# Patient Record
Sex: Female | Born: 1965 | Smoking: Current every day smoker
Health system: Southern US, Community
[De-identification: ages and names within clinical notes are randomized; demographics above are authoritative.]

## PROBLEM LIST (undated history)

## (undated) DIAGNOSIS — F419 Anxiety disorder, unspecified: Secondary | ICD-10-CM

## (undated) HISTORY — DX: Anxiety disorder, unspecified: F41.9

## (undated) HISTORY — PX: OTHER SURGICAL HISTORY: SHX169

## (undated) HISTORY — PX: COLONOSCOPY: SHX174

---

## 1998-12-28 HISTORY — PX: PLACEMENT OF BREAST IMPLANTS: SHX6334

## 2011-09-17 ENCOUNTER — Other Ambulatory Visit (HOSPITAL_COMMUNITY)
Admission: RE | Admit: 2011-09-17 | Discharge: 2011-09-17 | Disposition: A | Discharge status: Home / Self Care | Payer: 59 | Source: Ambulatory Visit | Attending: Family Medicine | Admitting: Family Medicine

## 2011-09-17 DIAGNOSIS — Z1159 Encounter for screening for other viral diseases: Secondary | ICD-10-CM | POA: Insufficient documentation

## 2011-09-17 DIAGNOSIS — Z124 Encounter for screening for malignant neoplasm of cervix: Secondary | ICD-10-CM | POA: Insufficient documentation

## 2014-01-05 ENCOUNTER — Other Ambulatory Visit: Payer: Self-pay | Admitting: Endocrinology

## 2014-01-05 DIAGNOSIS — R1011 Right upper quadrant pain: Secondary | ICD-10-CM

## 2014-01-10 ENCOUNTER — Other Ambulatory Visit: Payer: 59

## 2015-08-20 ENCOUNTER — Encounter (HOSPITAL_COMMUNITY): Payer: Self-pay | Admitting: Emergency Medicine

## 2015-08-20 DIAGNOSIS — T819XXA Unspecified complication of procedure, initial encounter: Secondary | ICD-10-CM | POA: Diagnosis present

## 2015-08-20 DIAGNOSIS — T8189XA Other complications of procedures, not elsewhere classified, initial encounter: Secondary | ICD-10-CM | POA: Diagnosis not present

## 2015-08-20 DIAGNOSIS — Z720 Tobacco use: Secondary | ICD-10-CM | POA: Diagnosis not present

## 2015-08-20 DIAGNOSIS — Y848 Other medical procedures as the cause of abnormal reaction of the patient, or of later complication, without mention of misadventure at the time of the procedure: Secondary | ICD-10-CM | POA: Insufficient documentation

## 2015-08-20 LAB — BASIC METABOLIC PANEL
Anion gap: 10 (ref 5–15)
BUN: 9 mg/dL (ref 6–20)
CO2: 26 mmol/L (ref 22–32)
CREATININE: 0.59 mg/dL (ref 0.44–1.00)
Calcium: 9 mg/dL (ref 8.9–10.3)
Chloride: 99 mmol/L — ABNORMAL LOW (ref 101–111)
GFR calc Af Amer: >60 mL/min (ref >60)
GFR calc non Af Amer: >60 mL/min (ref >60)
GLUCOSE: 126 mg/dL — AB (ref 65–99)
Potassium: 3.7 mmol/L (ref 3.5–5.1)
SODIUM: 135 mmol/L (ref 135–145)

## 2015-08-20 LAB — CBC WITH DIFFERENTIAL/PLATELET
Basophils Absolute: 0 10*3/uL (ref 0.0–0.1)
Basophils Relative: 0 % (ref 0–1)
EOS ABS: 0.1 10*3/uL (ref 0.0–0.7)
Eosinophils Relative: 1 % (ref 0–5)
HCT: 34.1 % — ABNORMAL LOW (ref 36.0–46.0)
HEMOGLOBIN: 11.6 g/dL — AB (ref 12.0–15.0)
LYMPHS ABS: 1.7 10*3/uL (ref 0.7–4.0)
Lymphocytes Relative: 15 % (ref 12–46)
MCH: 30.5 pg (ref 26.0–34.0)
MCHC: 34 g/dL (ref 30.0–36.0)
MCV: 89.7 fL (ref 78.0–100.0)
MONOS PCT: 8 % (ref 3–12)
Monocytes Absolute: 0.9 10*3/uL (ref 0.1–1.0)
Neutro Abs: 9 10*3/uL — ABNORMAL HIGH (ref 1.7–7.7)
Neutrophils Relative %: 76 % (ref 43–77)
Platelets: 407 10*3/uL — ABNORMAL HIGH (ref 150–400)
RBC: 3.8 MIL/uL — ABNORMAL LOW (ref 3.87–5.11)
RDW: 13 % (ref 11.5–15.5)
WBC: 11.7 10*3/uL — ABNORMAL HIGH (ref 4.0–10.5)

## 2015-08-20 NOTE — ED Notes (Signed)
Pt reports tummy-tuck on Thursday which she had to have operated on a second time- sts incision went down to muscle. Today when she takes her compression dressing off copious amounts of clear/tan fluid comes out. Pt using many dressings.

## 2015-08-21 ENCOUNTER — Other Ambulatory Visit (HOSPITAL_COMMUNITY): Payer: Self-pay | Admitting: Endocrinology

## 2015-08-21 ENCOUNTER — Emergency Department (HOSPITAL_COMMUNITY)
Admission: EM | Admit: 2015-08-21 | Discharge: 2015-08-21 | Disposition: A | Discharge status: Home / Self Care | Payer: Managed Care, Other (non HMO) | Attending: Emergency Medicine | Admitting: Emergency Medicine

## 2015-08-21 ENCOUNTER — Ambulatory Visit (HOSPITAL_COMMUNITY)
Admission: RE | Admit: 2015-08-21 | Discharge: 2015-08-21 | Disposition: A | Discharge status: Home / Self Care | Payer: Managed Care, Other (non HMO) | Source: Ambulatory Visit | Attending: Endocrinology | Admitting: Endocrinology

## 2015-08-21 ENCOUNTER — Ambulatory Visit (HOSPITAL_COMMUNITY)
Admission: RE | Admit: 2015-08-21 | Discharge: 2015-08-21 | Disposition: A | Discharge status: Home / Self Care | Payer: Managed Care, Other (non HMO) | Source: Ambulatory Visit | Attending: Interventional Radiology | Admitting: Interventional Radiology

## 2015-08-21 DIAGNOSIS — K769 Liver disease, unspecified: Secondary | ICD-10-CM | POA: Diagnosis not present

## 2015-08-21 DIAGNOSIS — T8189XA Other complications of procedures, not elsewhere classified, initial encounter: Secondary | ICD-10-CM

## 2015-08-21 DIAGNOSIS — L7622 Postprocedural hemorrhage and hematoma of skin and subcutaneous tissue following other procedure: Secondary | ICD-10-CM | POA: Insufficient documentation

## 2015-08-21 DIAGNOSIS — IMO0002 Reserved for concepts with insufficient information to code with codable children: Secondary | ICD-10-CM

## 2015-08-21 DIAGNOSIS — S301XXA Contusion of abdominal wall, initial encounter: Secondary | ICD-10-CM

## 2015-08-21 DIAGNOSIS — T8131XA Disruption of external operation (surgical) wound, not elsewhere classified, initial encounter: Secondary | ICD-10-CM | POA: Insufficient documentation

## 2015-08-21 DIAGNOSIS — Y838 Other surgical procedures as the cause of abnormal reaction of the patient, or of later complication, without mention of misadventure at the time of the procedure: Secondary | ICD-10-CM | POA: Diagnosis not present

## 2015-08-21 MED ORDER — LIDOCAINE-EPINEPHRINE 2 %-1:100000 IJ SOLN
INTRAMUSCULAR | Status: AC
  Filled 2015-08-21: qty 1

## 2015-08-21 MED ORDER — IOHEXOL 300 MG/ML  SOLN
100.0000 mL | Freq: Once | INTRAMUSCULAR | Status: AC | PRN
  Administered 2015-08-21: 100 mL via INTRAVENOUS

## 2015-08-21 MED ORDER — IOHEXOL 300 MG/ML  SOLN
50.0000 mL | Freq: Once | INTRAMUSCULAR | Status: AC | PRN
  Administered 2015-08-21: 50 mL via ORAL

## 2015-08-21 NOTE — ED Provider Notes (Signed)
This chart was scribed for [name], DO by [name], ED Scribe. This patient was seen in room B19C/B19C and the patient's care was started 12:50 AM.   TIME SEEN: 12:50 AM   CHIEF COMPLAINT:  Chief Complaint  Patient presents with  . Post-op Problem     HPI:  HPI Comments: Allison Lewis is a 49 y.o. female without any pertinent past medical history who presents to the Emergency Department here for a post-op problem this evening. Pt underwent an abdominoplasty and liposuction performed in June of this year. Wound was recently excised due to dehiscence Thursday 8/18. [name] has noted a large amount of drainage from the healing wound in last 24 hours. Denies any signs of infection such as worsening drainage or warmth. She denies any worsening pain to abdomen. She is taking Percocet daily which is successfully managing her pain.Pt is also on Amoxicillin daily. No fever, chills, nausea, or vomiting. No known allergies to medications.  Surgery performed by: Dr. Nathanial Rancher at [facility]  ROS: See HPI Constitutional: no fever  Eyes: no drainage  ENT: no runny nose   Cardiovascular:  no chest pain  Resp: no SOB  GI: no vomiting. Positive abdominal pain GU: no dysuria Integumentary: no rash  Allergy: no hives  Musculoskeletal: no leg swelling  Neurological: no slurred speech Skin: Positive wound ROS otherwise negative  PAST MEDICAL HISTORY/PAST SURGICAL HISTORY:  History reviewed. No pertinent past medical history.  MEDICATIONS:  Prior to Admission medications   Not on File    ALLERGIES:  No Known Allergies  SOCIAL HISTORY:  Social History  Substance Use Topics  . Smoking status: Current Every Day Smoker -- 0.50 packs/day    Types: Cigarettes  . Smokeless tobacco: Not on file  . Alcohol Use: Yes     Comment: 3 days a week    FAMILY HISTORY: No family history on file.  EXAM: BP 109/81 mmHg  Pulse 95   Temp(Src) 98.6 F (37 C) (Oral)  Resp 16  Ht 5\' 6"  (1.676 m)  Wt 160 lb (72.576 kg)  BMI 25.84 kg/m2  SpO2 95%  LMP 07/24/2015 CONSTITUTIONAL: Alert and oriented and responds appropriately to questions. Well-appearing; well-nourished HEAD: Normocephalic EYES: Conjunctivae clear, PERRL ENT: normal nose; no rhinorrhea; moist mucous membranes; pharynx without lesions noted NECK: Supple, no meningismus, no LAD  CARD: RRR; S1 and S2 appreciated; no murmurs, no clicks, no rubs, no gallops RESP: Normal chest excursion without splinting or tachypnea; breath sounds clear and equal bilaterally; no wheezes, no rhonchi, no rales, no hypoxia or respiratory distress, speaking full sentences ABD/GI: Normal bowel sounds; non-distended; soft, Mild tenderness to palpation throughout abdomen, no rebound, no guarding, no peritoneal signs; 10 cm Lower transverse abdominal surgical scar with 1 cm area of mild dehiscence at the L lateral aspect with small amount of serousanguineous drainage. No purulent drainage. Some erythema and eccymosis around scar without warmth BACK:  The back appears normal and is non-tender to palpation, there is no CVA tenderness EXT: Normal ROM in all joints; non-tender to palpation; no edema; normal capillary refill; no cyanosis, no calf tenderness or swelling    SKIN: Normal color for age and race; warm NEURO: Moves all extremities equally, sensation to light touch intact diffusely, cranial nerves II through XII intact PSYCH: The patient's mood and manner are appropriate. Grooming and personal hygiene are appropriate.  MEDICAL DECISION MAKING: Patient here with drainage from her surgical incision. Reports she  has had a seroma in the past that was aspirated during several visits with her plastic surgeon. No fever, purulent drainage. She has a very small open area to her surgical incision with a small amount of serous and was drainage today. No sign of infection. Abdomen seems appropriately  tender. Hemodynamically stable. Labs unremarkable. At this time I do not feel she needs emergent imaging and have discussed this with patient. Will contact plastic surgery on-call for further recommendations.  ED PROGRESS: 1:40 AM  Attempted to get in contact with patient's plastic surgeon several times without success. She is also left several messages with the answering service. Given drainage has almost completely resolved and patient is hemodynamically stable without signs of infection I feel she is safe to be discharged and follow-up with her plastic surgeon in the morning. Patient is a nurse herself and seems very reliable. Discussed with her return precautions. She is comfortable with this plan.      I personally performed the services described in this documentation, which was scribed in my presence. The recorded information has been reviewed and is accurate.   [place], DO 08/21/15 0140

## 2015-08-21 NOTE — ED Notes (Signed)
Pt reports taking a percocet at 2230 in waiting room, reports pain is reasonably well-managed.

## 2015-08-21 NOTE — Procedures (Signed)
Technically successful US guided placed of a 8 Fr drainage catheter placement into the right mid anterior abdominal wall yielding 60 cc of slightly red tinged serous fluid.   All aspirated samples sent to the laboratory for analysis.   No immediate post procedural complications.   Ronny Bacon, MD Pager #: [phone]

## 2015-08-21 NOTE — Discharge Instructions (Signed)
Please call your plastic surgeon in the morning to schedule a follow-up appointment. If your drainage increases and you cannot get in touch with your doctor, please return to the hospital. If you have increasing pain, swelling of your abdomen, vomiting and cannot stop, fever, please return to the ER.    Incision Care An incision is when a surgeon cuts into your body tissues. After surgery, the incision needs to be cared for properly to prevent infection.  HOME CARE INSTRUCTIONS   Take all medicine as directed by your caregiver. Only take over-the-counter or prescription medicines for pain, discomfort, or fever as directed by your caregiver.  Do not remove your bandage (dressing) or get your incision wet until your surgeon gives you permission. In the event that your dressing becomes wet, dirty, or starts to smell, change the dressing and call your surgeon for instructions as soon as possible.  Take showers. Do not take tub baths, swim, or do anything that may soak the wound until it is healed.  Resume your normal diet and activities as directed or allowed.  Avoid lifting any weight until you are instructed otherwise.  Use anti-itch antihistamine medicine as directed by your caregiver. The wound may itch when it is healing. Do not pick or scratch at the wound.  Follow up with your caregiver for stitch (suture) or staple removal as directed.  Drink enough fluids to keep your urine clear or pale yellow. SEEK MEDICAL CARE IF:   You have redness, swelling, or increasing pain in the wound that is not controlled with medicine.  You have drainage, blood, or pus coming from the wound that lasts longer than 1 day.  You develop muscle aches, chills, or a general ill feeling.  You notice a bad smell coming from the wound or dressing.  Your wound edges separate after the sutures, staples, or skin adhesive strips have been removed.  You develop persistent nausea or vomiting. SEEK IMMEDIATE  MEDICAL CARE IF:   You have a fever.  You develop a rash.  You develop dizzy episodes or faint while standing.  You have difficulty breathing.  You develop any reaction or side effects to medicine given. MAKE SURE YOU:   Understand these instructions.  Will watch your condition.  Will get help right away if you are not doing well or get worse. Document Released: 07/03/2005 Document Revised: 03/07/2012 Document Reviewed: 02/07/2014 [facility] Patient Information 2015 [place], [place]. This information is not intended to replace advice given to you by your health care provider. Make sure you discuss any questions you have with your health care provider.

## 2015-08-24 LAB — CULTURE, ROUTINE-ABSCESS: Special Requests: NORMAL

## 2015-08-26 ENCOUNTER — Other Ambulatory Visit: Payer: Self-pay | Admitting: Interventional Radiology

## 2015-08-26 DIAGNOSIS — T792XXA Traumatic secondary and recurrent hemorrhage and seroma, initial encounter: Principal | ICD-10-CM

## 2015-08-26 DIAGNOSIS — T888XXA Other specified complications of surgical and medical care, not elsewhere classified, initial encounter: Principal | ICD-10-CM

## 2015-08-26 DIAGNOSIS — IMO0001 Reserved for inherently not codable concepts without codable children: Secondary | ICD-10-CM

## 2015-08-28 ENCOUNTER — Other Ambulatory Visit: Payer: Self-pay | Admitting: Physician Assistant

## 2015-08-29 ENCOUNTER — Ambulatory Visit (HOSPITAL_COMMUNITY)
Admission: RE | Admit: 2015-08-29 | Discharge: 2015-08-29 | Disposition: A | Discharge status: Home / Self Care | Payer: Managed Care, Other (non HMO) | Source: Ambulatory Visit | Attending: Interventional Radiology | Admitting: Interventional Radiology

## 2015-08-29 ENCOUNTER — Other Ambulatory Visit: Payer: Self-pay | Admitting: Interventional Radiology

## 2015-08-29 DIAGNOSIS — IMO0001 Reserved for inherently not codable concepts without codable children: Secondary | ICD-10-CM

## 2015-08-29 DIAGNOSIS — L7621 Postprocedural hemorrhage and hematoma of skin and subcutaneous tissue following a dermatologic procedure: Secondary | ICD-10-CM | POA: Diagnosis not present

## 2015-08-29 DIAGNOSIS — T792XXA Traumatic secondary and recurrent hemorrhage and seroma, initial encounter: Secondary | ICD-10-CM

## 2015-08-29 DIAGNOSIS — Z4803 Encounter for change or removal of drains: Secondary | ICD-10-CM | POA: Diagnosis present

## 2015-08-29 DIAGNOSIS — T888XXA Other specified complications of surgical and medical care, not elsewhere classified, initial encounter: Principal | ICD-10-CM

## 2015-08-29 MED ORDER — ALCOHOL 98 % IV SOLN
50.0000 mL | Freq: Once | INTRAVENOUS | Status: AC
  Administered 2015-08-29: 25 mL
  Filled 2015-08-29: qty 50

## 2015-08-29 MED ORDER — IOHEXOL 300 MG/ML  SOLN
40.0000 mL | Freq: Once | INTRAMUSCULAR | Status: DC | PRN
  Administered 2015-08-29: 11:00:00
  Filled 2015-08-29: qty 40

## 2015-08-29 MED ORDER — LIDOCAINE HCL 1 % IJ SOLN
INTRAMUSCULAR | Status: AC
  Filled 2015-08-29: qty 20

## 2017-12-09 ENCOUNTER — Ambulatory Visit (INDEPENDENT_AMBULATORY_CARE_PROVIDER_SITE_OTHER): Payer: Self-pay

## 2017-12-09 ENCOUNTER — Ambulatory Visit (INDEPENDENT_AMBULATORY_CARE_PROVIDER_SITE_OTHER): Payer: Self-pay | Admitting: Orthopedic Surgery

## 2017-12-09 ENCOUNTER — Encounter (INDEPENDENT_AMBULATORY_CARE_PROVIDER_SITE_OTHER): Payer: Self-pay | Admitting: Orthopedic Surgery

## 2017-12-09 ENCOUNTER — Ambulatory Visit (INDEPENDENT_AMBULATORY_CARE_PROVIDER_SITE_OTHER): Payer: BLUE CROSS/BLUE SHIELD | Admitting: Orthopedic Surgery

## 2017-12-09 DIAGNOSIS — M25561 Pain in right knee: Secondary | ICD-10-CM

## 2017-12-09 NOTE — Progress Notes (Signed)
Office Visit Note   Patient: Allison Lewis           Date of Birth: 13-Jul-1966           MRN: [id number] Visit Date: 12/09/2017 Requested by: Reynold Bowen, MD [place], [place] [address] PCP: Reynold Bowen, MD  Subjective: Chief Complaint  Patient presents with  . Right Knee - Pain    HPI: Allison Lewis is a 51 year old patient with right knee pain.  Date of injury 10/30/2017 when she missed a curb step and sustained a twisting injury to the right knee.  She had swelling after that for 4-5 days and was unable to weight-bear during that time.  States that all of her pain is medial.  She has not had any symptomatic instability but does report a fairly significant pain in the medial aspect of the knee.  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  She states it really is not getting any better and she is having trouble achieving full extension.              ROS: All systems reviewed are negative as they relate to the chief complaint within the history of present illness.  Patient denies  fevers or chills.   Assessment & Plan: Visit Diagnoses:  1. Acute pain of right knee     Plan: Impression is right knee pain with radiographic abnormality of calcific ossicles near the medial epicondyle.  This could represent a medial epicondyle avulsion injury which is now 73 weeks old.  She is also having trouble with full extension which could indicate locked meniscal tear.  Patient needs MRI scan to evaluate the medial soft tissues of the knee including the proximal MCL and meniscus.  I will see her back after that study.  Follow-Up Instructions: Return for after MRI.   Orders:  Orders Placed This Encounter  Procedures  . XR KNEE 3 VIEW RIGHT  . MR Knee Right w/o contrast   No orders of the defined types were placed in this encounter.     Procedures: No procedures performed   Clinical Data: No additional findings.  Objective: Vital Signs: There  were no vitals taken for this visit.  Physical Exam:   Constitutional: Patient appears well-developed HEENT:  Head: Normocephalic Eyes:EOM are normal Neck: Normal range of motion Cardiovascular: Normal rate Pulmonary/chest: Effort normal Neurologic: Patient is alert Skin: Skin is warm Psychiatric: Patient has normal mood and affect    Ortho Exam: Orthopedic exam demonstrates slightly antalgic gait to the right with palpable pedal pulses no groin pain with internal/external rotation of the leg skin is intact in the right knee region and there is no effusion.  She has generally global tenderness around the medial aspect of the knee.  Hard to get a great ACL exam on her because of relaxation issues.  She is lacking about 10 degrees of full extension.  Soft endpoint present.  PCL is intact and she does have a little bit of opening to valgus stress at both 0 and 30 degrees.  No opening to varus stress at 0 and 30 degrees.  Specialty Comments:  No specialty comments available.  Imaging: Xr Knee 3 View Right  Result Date: 12/09/2017 AP lateral merchant view right knee reviewed.  There is maintenance of joint space medially and laterally.  Faint ossification seen 1-2 mm displaced from the medial epicondyle.  No other fractures or effusion in the right knee.  Coronal alignment normal.    PMFS History: There are no active problems to display for this patient.  History reviewed. No pertinent past medical history.  History reviewed. No pertinent family history.  Past Surgical History:  Procedure Laterality Date  . ABDOMINAL SURGERY     Social History   Occupational History  . Not on file  Tobacco Use  . Smoking status: Current Every Day Smoker    Packs/day: 0.50    Types: Cigarettes  Substance and Sexual Activity  . Alcohol use: Yes    Comment: 3 days a week  . Drug use: No  . Sexual activity: Not on file

## 2017-12-15 ENCOUNTER — Ambulatory Visit
Admission: RE | Admit: 2017-12-15 | Discharge: 2017-12-15 | Disposition: A | Discharge status: Home / Self Care | Payer: BLUE CROSS/BLUE SHIELD | Source: Ambulatory Visit | Attending: Orthopedic Surgery | Admitting: Orthopedic Surgery

## 2017-12-15 DIAGNOSIS — M25561 Pain in right knee: Secondary | ICD-10-CM

## 2018-01-14 ENCOUNTER — Ambulatory Visit (INDEPENDENT_AMBULATORY_CARE_PROVIDER_SITE_OTHER): Payer: BLUE CROSS/BLUE SHIELD | Admitting: Orthopedic Surgery

## 2018-01-14 ENCOUNTER — Encounter (INDEPENDENT_AMBULATORY_CARE_PROVIDER_SITE_OTHER): Payer: Self-pay | Admitting: Orthopedic Surgery

## 2018-01-14 DIAGNOSIS — S83411D Sprain of medial collateral ligament of right knee, subsequent encounter: Secondary | ICD-10-CM | POA: Diagnosis not present

## 2018-01-15 ENCOUNTER — Encounter (INDEPENDENT_AMBULATORY_CARE_PROVIDER_SITE_OTHER): Payer: Self-pay | Admitting: Orthopedic Surgery

## 2018-01-15 NOTE — Progress Notes (Signed)
   Office Visit Note   Patient: Allison Lewis           Date of Birth: Jun 08, 1966           MRN: [id number] Visit Date: 01/14/2018 Requested by: Reynold Bowen, MD [place], [place] [address] PCP: Reynold Bowen, MD  Subjective: Chief Complaint  Patient presents with  . Right Knee - Follow-up    HPI: Patient is a 52 year-old female with right knee pain..  Since I have seen her she has had an MRI scan.  She states it is still hard for her to sleep.  She had bilateral ankle fractures in 2016.  Patient takes ibuprofen twice a day.  She also uses tiger balm.  She denies any instability to the right knee.  MRI scan is reviewed with the patient and it shows grade 2 MCL sprain intact menisci and ligaments.              ROS: All systems reviewed are negative as they relate to the chief complaint within the history of present illness.  Patient denies  fevers or chills.   Assessment & Plan: Visit Diagnoses:  1. Sprain of medial collateral ligament of right knee, subsequent encounter     Plan: Impression is right knee MCL sprain with some residual inflammation and edema around the proximal attachment.  Plan is continued nonoperative management.  We will try a knee sleeve for when she has to walk around the hospital.  Patient is a smoker so the healing process may be slightly slower.  She is stable on exam.  We will try topical anti-inflammatory.  Follow-up as needed  Follow-Up Instructions: Return if symptoms worsen or fail to improve.   Orders:  No orders of the defined types were placed in this encounter.  No orders of the defined types were placed in this encounter.     Procedures: No procedures performed   Clinical Data: No additional findings.  Objective: Vital Signs: There were no vitals taken for this visit.  Physical Exam:   Constitutional: Patient appears well-developed HEENT:  Head: Normocephalic Eyes:EOM are normal Neck: Normal range of  motion Cardiovascular: Normal rate Pulmonary/chest: Effort normal Neurologic: Patient is alert Skin: Skin is warm Psychiatric: Patient has normal mood and affect    Ortho Exam: Orthopedic exam demonstrates pain with full extension and full flexion of the right knee but no effusion.  There is medial sided tenderness but the patient's knee is stable to valgus stress at 0 and 30 degrees.  Specialty Comments:  No specialty comments available.  Imaging: No results found.   PMFS History: There are no active problems to display for this patient.  History reviewed. No pertinent past medical history.  History reviewed. No pertinent family history.  Past Surgical History:  Procedure Laterality Date  . ABDOMINAL SURGERY     Social History   Occupational History  . Not on file  Tobacco Use  . Smoking status: Current Every Day Smoker    Packs/day: 0.50    Types: Cigarettes  Substance and Sexual Activity  . Alcohol use: Yes    Comment: 3 days a week  . Drug use: No  . Sexual activity: Not on file

## 2018-02-02 ENCOUNTER — Other Ambulatory Visit: Payer: Self-pay | Admitting: Endocrinology

## 2018-02-02 DIAGNOSIS — Z1231 Encounter for screening mammogram for malignant neoplasm of breast: Secondary | ICD-10-CM

## 2018-02-23 ENCOUNTER — Ambulatory Visit: Payer: BLUE CROSS/BLUE SHIELD

## 2018-05-23 ENCOUNTER — Emergency Department (HOSPITAL_COMMUNITY): Payer: BLUE CROSS/BLUE SHIELD

## 2018-05-23 ENCOUNTER — Emergency Department (HOSPITAL_COMMUNITY)
Admission: EM | Admit: 2018-05-23 | Discharge: 2018-05-23 | Disposition: A | Discharge status: Home / Self Care | Payer: BLUE CROSS/BLUE SHIELD | Attending: Emergency Medicine | Admitting: Emergency Medicine

## 2018-05-23 ENCOUNTER — Encounter (HOSPITAL_COMMUNITY): Payer: Self-pay | Admitting: Emergency Medicine

## 2018-05-23 ENCOUNTER — Other Ambulatory Visit: Payer: Self-pay

## 2018-05-23 DIAGNOSIS — R1011 Right upper quadrant pain: Secondary | ICD-10-CM | POA: Insufficient documentation

## 2018-05-23 DIAGNOSIS — F17210 Nicotine dependence, cigarettes, uncomplicated: Secondary | ICD-10-CM | POA: Diagnosis not present

## 2018-05-23 LAB — CBC
HCT: 42.3 % (ref 36.0–46.0)
HEMOGLOBIN: 13.9 g/dL (ref 12.0–15.0)
MCH: 29.3 pg (ref 26.0–34.0)
MCHC: 32.9 g/dL (ref 30.0–36.0)
MCV: 89.2 fL (ref 78.0–100.0)
Platelets: 329 10*3/uL (ref 150–400)
RBC: 4.74 MIL/uL (ref 3.87–5.11)
RDW: 13.4 % (ref 11.5–15.5)
WBC: 8.5 10*3/uL (ref 4.0–10.5)

## 2018-05-23 LAB — URINALYSIS, ROUTINE W REFLEX MICROSCOPIC
Bilirubin Urine: NEGATIVE
Glucose, UA: NEGATIVE mg/dL
KETONES UR: NEGATIVE mg/dL
Leukocytes, UA: NEGATIVE
Nitrite: NEGATIVE
PROTEIN: NEGATIVE mg/dL
Specific Gravity, Urine: 1.018 (ref 1.005–1.030)
pH: 5 (ref 5.0–8.0)

## 2018-05-23 LAB — COMPREHENSIVE METABOLIC PANEL
ALT: 14 U/L (ref 14–54)
AST: 19 U/L (ref 15–41)
Albumin: 4.3 g/dL (ref 3.5–5.0)
Alkaline Phosphatase: 60 U/L (ref 38–126)
Anion gap: 9 (ref 5–15)
BUN: 9 mg/dL (ref 6–20)
CHLORIDE: 106 mmol/L (ref 101–111)
CO2: 23 mmol/L (ref 22–32)
Calcium: 9.2 mg/dL (ref 8.9–10.3)
Creatinine, Ser: 0.74 mg/dL (ref 0.44–1.00)
GFR calc Af Amer: >60 mL/min (ref >60)
GFR calc non Af Amer: >60 mL/min (ref >60)
Glucose, Bld: 107 mg/dL — ABNORMAL HIGH (ref 65–99)
POTASSIUM: 4.3 mmol/L (ref 3.5–5.1)
SODIUM: 138 mmol/L (ref 135–145)
TOTAL PROTEIN: 7.3 g/dL (ref 6.5–8.1)
Total Bilirubin: 0.9 mg/dL (ref 0.3–1.2)

## 2018-05-23 LAB — I-STAT BETA HCG BLOOD, ED (MC, WL, AP ONLY)

## 2018-05-23 LAB — LIPASE, BLOOD: LIPASE: 31 U/L (ref 11–51)

## 2018-05-23 MED ORDER — DICYCLOMINE HCL 20 MG PO TABS: 20.0000 mg | ORAL_TABLET | Freq: Two times a day (BID) | ORAL | 0 refills | Status: DC

## 2018-05-23 MED ORDER — ONDANSETRON HCL 4 MG/2ML IJ SOLN
4.0000 mg | Freq: Once | INTRAMUSCULAR | Status: AC
  Administered 2018-05-23: 4 mg via INTRAVENOUS
  Filled 2018-05-23: qty 2

## 2018-05-23 MED ORDER — IOHEXOL 300 MG/ML  SOLN
100.0000 mL | Freq: Once | INTRAMUSCULAR | Status: AC | PRN
  Administered 2018-05-23: 100 mL via INTRAVENOUS

## 2018-05-23 MED ORDER — MORPHINE SULFATE (PF) 4 MG/ML IV SOLN
4.0000 mg | Freq: Once | INTRAVENOUS | Status: AC
Start: 2018-05-23 — End: 2018-05-23
  Administered 2018-05-23: 4 mg via INTRAVENOUS
  Filled 2018-05-23: qty 1

## 2018-05-23 MED ORDER — ONDANSETRON 8 MG PO TBDP: 8.0000 mg | ORAL_TABLET | Freq: Three times a day (TID) | ORAL | 0 refills | Status: DC | PRN

## 2018-05-23 NOTE — ED Provider Notes (Signed)
[facility] EMERGENCY DEPARTMENT Provider Note   CSN: [id number] Arrival date & time: 05/23/18  1024     History   Chief Complaint Chief Complaint  Patient presents with  . Abdominal Pain    HPI Allison Lewis is a 52 y.o. female.  HPI Allison Lewis is a 52 y.o. female presents to emergency department complaining of right upper quadrant abdominal pain for 3 days.  She states pain comes and goes, it is sharp.  Reports associated nausea and vomiting that has been intermittent.  She states some eating makes pain worse, nothing makes it better.  Denies any flank pain.  No urinary symptoms.  No fever or chills.  She has been taking over-the-counter medications which have not helped.  No history of similar pain in the past.  Reports some loose stools. No sick contacts.  History reviewed. No pertinent past medical history.  There are no active problems to display for this patient.   Past Surgical History:  Procedure Laterality Date  . ABDOMINAL SURGERY       OB History   None      Home Medications    Prior to Admission medications   Medication Sig Start Date End Date Taking? Authorizing Provider  ibuprofen (ADVIL,MOTRIN) 200 MG tablet Take 400 mg by mouth every 6 (six) hours as needed.   Yes [provider]  LORazepam (ATIVAN) 0.5 MG tablet TK 1 T PO TID PRN 09/05/17  Yes [provider]    Family History History reviewed. No pertinent family history.  Social History Social History   Tobacco Use  . Smoking status: Current Every Day Smoker    Packs/day: 0.50    Types: E-cigarettes  . Smokeless tobacco: Never Used  Substance Use Topics  . Alcohol use: Yes    Comment: 3 days a week  . Drug use: No     Allergies   Patient has no known allergies.   Review of Systems Review of Systems  Constitutional: Negative for chills and fever.  Respiratory: Negative for cough, chest tightness and shortness of breath.     Cardiovascular: Negative for chest pain, palpitations and leg swelling.  Gastrointestinal: Positive for diarrhea, nausea and vomiting. Negative for abdominal pain.  Genitourinary: Negative for dysuria.  Musculoskeletal: Negative for arthralgias, myalgias, neck pain and neck stiffness.  Skin: Negative for rash.  Neurological: Negative for dizziness, weakness and headaches.  All other systems reviewed and are negative.    Physical Exam Updated Vital Signs BP (!) 133/100 (BP Location: Right Arm)   Pulse 82   Temp 98.4 F (36.9 C) (Oral)   Resp 18   Ht 5\' 5"  (1.651 m)   Wt 74.8 kg (165 lb)   LMP 04/23/2018   SpO2 97%   BMI 27.46 kg/m   Physical Exam  Constitutional: She appears well-developed and well-nourished. No distress.  HENT:  Head: Normocephalic.  Eyes: Conjunctivae are normal.  Neck: Neck supple.  Cardiovascular: Normal rate, regular rhythm and normal heart sounds.  Pulmonary/Chest: Effort normal and breath sounds normal. No respiratory distress. She has no wheezes. She has no rales.  Abdominal: Soft. Bowel sounds are normal. She exhibits no distension. There is tenderness in the left upper quadrant. There is no rebound and no guarding.  Musculoskeletal: She exhibits no edema.  Neurological: She is alert.  Skin: Skin is warm and dry.  Psychiatric: She has a normal mood and affect. Her behavior is normal.  Nursing note and vitals reviewed.  ED Treatments / Results  Labs (all labs ordered are listed, but only abnormal results are displayed) Labs Reviewed  COMPREHENSIVE METABOLIC PANEL - Abnormal; Notable for the following components:      Result Value   Glucose, Bld 107 (*)    All other components within normal limits  URINALYSIS, ROUTINE W REFLEX MICROSCOPIC - Abnormal; Notable for the following components:   APPearance HAZY (*)    Hgb urine dipstick SMALL (*)    Bacteria, UA RARE (*)    All other components within normal limits  LIPASE, BLOOD  CBC   I-STAT BETA HCG BLOOD, ED ([facility], [facility], [facility] ONLY)    EKG None  Radiology US Abdomen Complete  Result Date: 05/23/2018 CLINICAL DATA:  Right-sided abdominal pain. EXAM: ABDOMEN ULTRASOUND COMPLETE COMPARISON:  None. FINDINGS: Gallbladder: No gallstones or wall thickening visualized. No sonographic Murphy sign noted by sonographer. Common bile duct: Diameter: 3.6 mm Liver: No focal lesion identified. Within normal limits in parenchymal echogenicity. Portal vein is patent on color Doppler imaging with normal direction of blood flow towards the liver. IVC: No abnormality visualized. Pancreas: Visualized portion unremarkable. Spleen: Size and appearance within normal limits. Right Kidney: Length: 11.7 cm. Echogenicity within normal limits. No mass or hydronephrosis visualized. Left Kidney: Length: 11.0 cm. Echogenicity within normal limits. Moderate hydronephrosis visualized. 1 cm benign-appearing cyst within the mid polar left renal pelvis. Abdominal aorta: No aneurysm visualized. Other findings: None. IMPRESSION: Moderate left hydronephrosis. Benign-appearing 1 cm left renal cyst. Otherwise unremarkable abdominal ultrasound. Electronically Signed   By: Fidela Salisbury M.D.   On: 05/23/2018 13:51    Procedures Procedures (including critical care time)  Medications Ordered in ED Medications  morphine 4 MG/ML injection 4 mg (has no administration in time range)  ondansetron (ZOFRAN) injection 4 mg (has no administration in time range)     Initial Impression / Assessment and Plan / ED Course  I have reviewed the triage vital signs and the nursing notes.  Pertinent labs & imaging results that were available during my care of the patient were reviewed by me and considered in my medical decision making (see chart for details).     She did emergency department with right upper quadrant abdominal pain.  Reports associated nausea, vomiting, diarrhea.  She is tender in her right upper quadrant on  exam.  Suspect to be cholelithiasis versus cholecystitis.  Will check labs, urinalysis, get ultrasound of abdomen.   2:25 PM Only tearful in pain, very tender in the right upper quadrant.  Will get CT abdomen pelvis for further evaluation and give her some pain medicine and antiemetics.  CT scan is negative.  Discussed results with patient.  Will try Bentyl, Tylenol Motrin, will have a follow-up with family doctor for further evaluation.  I do not think she needs any further imaging on emergent basis at this time.  Her abdomen is soft.  Vital signs are normal patient stable for discharge home.  Return precautions discussed.  Vitals:   05/23/18 1430 05/23/18 1500 05/23/18 1630 05/23/18 1700  BP: 135/85 120/80  131/88  Pulse: 65 (!) 57 (!) 57   Resp:      Temp:      TempSrc:      SpO2: 98% 96% 97%   Weight:      Height:         Final Clinical Impressions(s) / ED Diagnoses   Final diagnoses:  Right upper quadrant abdominal pain    ED Discharge Orders  Ordered    dicyclomine (BENTYL) 20 MG tablet  2 times daily     05/23/18 1624    ondansetron (ZOFRAN ODT) 8 MG disintegrating tablet  Every 8 hours PRN     05/23/18 1624       Jeannett Senior, PA-C 05/24/18 1507    Noemi Chapel, MD 05/30/18 [phone]

## 2018-05-23 NOTE — Discharge Instructions (Addendum)
Take Tylenol or Motrin for pain.  Try Bentyl for abdominal spasms.  Zofran for nausea and vomiting as needed.  Drink plenty of fluids.  Follow-up with family doctor.  Return if worsening.

## 2018-05-23 NOTE — ED Notes (Signed)
Pt discharged from ED; instructions provided and scripts given; Pt encouraged to return to ED if symptoms worsen and to f/u with PCP; Pt verbalized understanding of all instructions 

## 2018-05-23 NOTE — ED Triage Notes (Signed)
Pt states she started having right sided abd pain on Thursday. The pain is sharp in nature and comes and goes. Pt states she has been having diarrhea and vomiting as well.

## 2018-12-07 ENCOUNTER — Other Ambulatory Visit: Payer: Self-pay | Admitting: Endocrinology

## 2018-12-07 DIAGNOSIS — R1031 Right lower quadrant pain: Secondary | ICD-10-CM

## 2018-12-18 ENCOUNTER — Ambulatory Visit
Admission: RE | Admit: 2018-12-18 | Discharge: 2018-12-18 | Disposition: A | Discharge status: Home / Self Care | Payer: Self-pay | Source: Ambulatory Visit | Attending: Endocrinology | Admitting: Endocrinology

## 2018-12-18 DIAGNOSIS — R1031 Right lower quadrant pain: Secondary | ICD-10-CM

## 2018-12-18 MED ORDER — GADOBENATE DIMEGLUMINE 529 MG/ML IV SOLN
15.0000 mL | Freq: Once | INTRAVENOUS | Status: AC | PRN
Start: 2018-12-18 — End: 2018-12-18
  Administered 2018-12-18: 15 mL via INTRAVENOUS

## 2018-12-23 ENCOUNTER — Encounter: Payer: Self-pay | Admitting: Internal Medicine

## 2019-01-16 ENCOUNTER — Ambulatory Visit: Payer: PRIVATE HEALTH INSURANCE | Admitting: Internal Medicine

## 2019-01-16 ENCOUNTER — Encounter: Payer: Self-pay | Admitting: Internal Medicine

## 2019-01-16 VITALS — BP 110/76 | HR 84 | Ht 65.0 in | Wt 175.0 lb

## 2019-01-16 DIAGNOSIS — R197 Diarrhea, unspecified: Secondary | ICD-10-CM | POA: Diagnosis not present

## 2019-01-16 DIAGNOSIS — R1031 Right lower quadrant pain: Secondary | ICD-10-CM | POA: Diagnosis not present

## 2019-01-16 DIAGNOSIS — Z1211 Encounter for screening for malignant neoplasm of colon: Secondary | ICD-10-CM | POA: Diagnosis not present

## 2019-01-16 DIAGNOSIS — M5441 Lumbago with sciatica, right side: Secondary | ICD-10-CM | POA: Diagnosis not present

## 2019-01-16 DIAGNOSIS — G8929 Other chronic pain: Secondary | ICD-10-CM

## 2019-01-16 MED ORDER — NA SULFATE-K SULFATE-MG SULF 17.5-3.13-1.6 GM/177ML PO SOLN: 1.0000 | Freq: Once | ORAL | 0 refills | Status: AC

## 2019-01-16 NOTE — Progress Notes (Signed)
HISTORY OF PRESENT ILLNESS:  Allison Lewis is a 53 y.o. female, Retail buyer at [employer], who was sent today by her primary care provider Dr. Reynold Bowen regarding chronic right lower quadrant pain.  The patient estimates that she is had right lower quadrant pain for approximately 1 year.  It seems to have progressed somewhat over time.  The discomfort is not affected by meals.  Her bowels tend to be on the loose side chronically.  This has not changed.  No bleeding.  The discomfort may be more noticeable for several days and then less bothersome for several days.  This will affect her occasionally at night.  She was empirically prescribed Bentyl which did not help.  There has been no nausea or vomiting.  She has had multiple imaging studies including abdominal ultrasound, CT scan of the abdomen and pelvis, and MRI of the abdomen.  No abnormalities except for incidental left kidney cyst which is being monitored.  She denies gynecologic problems such as endometriosis.  No prior abdominal surgeries.  She has had a tummy tuck.  The discomfort may be more noticeable or more prominent when she is at work and more active.  Of interest, patient does have lumbar back pain on the right.  After taking a steroid Dosepak her back improved and her right lower quadrant discomfort also improved.  She has not had a prior history of GI issues or GI procedures.  She has not had her screening colonoscopy.  In addition to outside x-rays as described.  The patient's blood work has been unremarkable.  REVIEW OF SYSTEMS:  All non-GI ROS negative unless otherwise stated in the HPI except for urinary leakage, urinary frequency, back pain  Past Medical History:  Diagnosis Date  . Anxiety     Past Surgical History:  Procedure Laterality Date  . TUMMY TUCK      Social History KRISTIA JUPITER  reports that she has been smoking e-cigarettes. She has been smoking about 0.50 packs per day. She has never  used smokeless tobacco. She reports current alcohol use. She reports that she does not use drugs.  family history includes Breast cancer in her mother; Liver cancer in her mother; Lung cancer in her maternal grandfather and maternal grandmother; Pancreatic cancer in her father; Uterine cancer in her mother.  No Known Allergies     PHYSICAL EXAMINATION: Vital signs: Ht 5' (1.524 m) Comment: height measured without shoes  Wt 175 lb (79.4 kg)   BMI 34.18 kg/m   Constitutional: generally well-appearing, no acute distress Psychiatric: alert and oriented x3, cooperative Eyes: extraocular movements intact, anicteric, conjunctiva pink Mouth: oral pharynx moist, no lesions Neck: supple no lymphadenopathy Cardiovascular: heart regular rate and rhythm, no murmur Lungs: clear to auscultation bilaterally Abdomen: soft, some focal tenderness in the right lower quadrant to deep palpation, nondistended, no obvious ascites, no peritoneal signs, normal bowel sounds, no organomegaly Rectal: Deferred until colonoscopy Extremities: no clubbing, cyanosis, or lower extremity edema bilaterally Skin: no lesions on visible extremities Neuro: No focal deficits.  Cranial nerves intact  ASSESSMENT:  1.  Chronic intermittent right lower quadrant pain.  Suspect musculoskeletal and possibly related to her back 2.  Chronic tendency toward loose bowels.  Worse 3.  Colon cancer screening.  Due   PLAN:  1.  Screening colonoscopy.  This to provide colorectal neoplasia screening.  Also to evaluate right lower quadrant pain and loose stools (though unlikely, rule out Crohn's.The nature of the procedure, as  well as the risks, benefits, and alternatives were carefully and thoroughly reviewed with the patient. Ample time for discussion and questions allowed. The patient understood, was satisfied, and agreed to proceed. 2.  If colonoscopy unrevealing then focus should be toward control of her back pain as she may be  experiencing referred right lower quadrant pain as discussed  A copy of this consultation note has been sent to Dr. Forde Dandy

## 2019-01-16 NOTE — Patient Instructions (Signed)

## 2019-01-23 ENCOUNTER — Encounter: Payer: Self-pay | Admitting: Internal Medicine

## 2019-01-23 ENCOUNTER — Ambulatory Visit (AMBULATORY_SURGERY_CENTER): Payer: PRIVATE HEALTH INSURANCE | Admitting: Internal Medicine

## 2019-01-23 VITALS — BP 130/82 | HR 60 | Temp 100.0°F | Resp 13 | Ht 66.0 in | Wt 175.0 lb

## 2019-01-23 DIAGNOSIS — D124 Benign neoplasm of descending colon: Secondary | ICD-10-CM

## 2019-01-23 DIAGNOSIS — Z1211 Encounter for screening for malignant neoplasm of colon: Secondary | ICD-10-CM

## 2019-01-23 DIAGNOSIS — K635 Polyp of colon: Secondary | ICD-10-CM | POA: Diagnosis not present

## 2019-01-23 DIAGNOSIS — D125 Benign neoplasm of sigmoid colon: Secondary | ICD-10-CM

## 2019-01-23 DIAGNOSIS — R1011 Right upper quadrant pain: Secondary | ICD-10-CM

## 2019-01-23 MED ORDER — SODIUM CHLORIDE 0.9 % IV SOLN: 500.0000 mL | Freq: Once | INTRAVENOUS | Status: AC

## 2019-01-23 NOTE — Op Note (Signed)
[facility] Patient Name: Allison Lewis Procedure Date: 01/23/2019 8:09 AM MRN: [id number] Endoscopist: Docia Chuck. Henrene Pastor , MD Age: 53 Referring MD:  Date of Birth: April 02, 1966 Gender: Female Account #: [id number] Procedure:                Colonoscopy with cold snare polypectomy x 2 Indications:              Screening for colorectal malignant neoplasm.                            Incidental complaint of right lower quadrant pain Medicines:                Monitored Anesthesia Care Procedure:                Pre-Anesthesia Assessment:                           - Prior to the procedure, a History and Physical                            was performed, and patient medications and                            allergies were reviewed. The patient's tolerance of                            previous anesthesia was also reviewed. The risks                            and benefits of the procedure and the sedation                            options and risks were discussed with the patient.                            All questions were answered, and informed consent                            was obtained. Prior Anticoagulants: The patient has                            taken no previous anticoagulant or antiplatelet                            agents. ASA Grade Assessment: I - A normal, healthy                            patient. After reviewing the risks and benefits,                            the patient was deemed in satisfactory condition to                            undergo the procedure.  After obtaining informed consent, the colonoscope                            was passed under direct vision. Throughout the                            procedure, the patient's blood pressure, pulse, and                            oxygen saturations were monitored continuously. The                            Colonoscope was introduced through the anus and      advanced to the the cecum, identified by                            appendiceal orifice and ileocecal valve. The                            terminal ileum, ileocecal valve, appendiceal                            orifice, and rectum were photographed. The quality                            of the bowel preparation was excellent. The                            colonoscopy was performed without difficulty. The                            patient tolerated the procedure well. The bowel                            preparation used was SUPREP. Scope In: 8:22:12 AM Scope Out: 1:61:09 AM Scope Withdrawal Time: 0 hours 11 minutes 10 seconds  Total Procedure Duration: 0 hours 14 minutes 32 seconds  Findings:                 The terminal ileum appeared normal.                           Two polyps were found in the sigmoid colon and                            descending colon. The polyps were 5 mm in size.                            These polyps were removed with a cold snare.                            Resection and retrieval were complete.                           Internal hemorrhoids  were found during                            retroflexion. The hemorrhoids were small.                           The exam was otherwise without abnormality on                            direct and retroflexion views. Complications:            No immediate complications. Estimated blood loss:                            None. Estimated Blood Loss:     Estimated blood loss: none. Impression:               - The examined portion of the ileum was normal.                           - Two 5 mm polyps in the sigmoid colon and in the                            descending colon, removed with a cold snare.                            Resected and retrieved.                           - Internal hemorrhoids.                           - The examination was otherwise normal on direct                            and retroflexion  views. Recommendation:           - Repeat colonoscopy in 5 years for surveillance.                           - Patient has a contact number available for                            emergencies. The signs and symptoms of potential                            delayed complications were discussed with the                            patient. Return to normal activities tomorrow.                            Written discharge instructions were provided to the                            patient.                           -  Resume previous diet.                           - Continue present medications.                           - Await pathology results. Docia Chuck. Henrene Pastor, MD 01/23/2019 8:42:47 AM This report has been signed electronically.

## 2019-01-23 NOTE — Patient Instructions (Addendum)
Impression/Recommendations:  Polyp handout given to patient. Hemorrhoid handout given to patient.  Repeat colonoscopy in 5 years for surveillance.  Date to be determined after pathology results reviewed.  Resume previous diet. Continue present medications.  Await pathology results.  YOU HAD AN ENDOSCOPIC PROCEDURE TODAY AT [facility]:   Refer to the procedure report that was given to you for any specific questions about what was found during the examination.  If the procedure report does not answer your questions, please call your gastroenterologist to clarify.  If you requested that your care partner not be given the details of your procedure findings, then the procedure report has been included in a sealed envelope for you to review at your convenience later.  YOU SHOULD EXPECT: Some feelings of bloating in the abdomen. Passage of more gas than usual.  Walking can help get rid of the air that was put into your GI tract during the procedure and reduce the bloating. If you had a lower endoscopy (such as a colonoscopy or flexible sigmoidoscopy) you may notice spotting of blood in your stool or on the toilet paper. If you underwent a bowel prep for your procedure, you may not have a normal bowel movement for a few days.  Please Note:  You might notice some irritation and congestion in your nose or some drainage.  This is from the oxygen used during your procedure.  There is no need for concern and it should clear up in a day or so.  SYMPTOMS TO REPORT IMMEDIATELY:   Following lower endoscopy (colonoscopy or flexible sigmoidoscopy):  Excessive amounts of blood in the stool  Significant tenderness or worsening of abdominal pains  Swelling of the abdomen that is new, acute  Fever of 100F or higher For urgent or emergent issues, a gastroenterologist can be reached at any hour by calling [phone].   DIET:  We do recommend a small meal at first, but then you may  proceed to your regular diet.  Drink plenty of fluids but you should avoid alcoholic beverages for 24 hours.  ACTIVITY:  You should plan to take it easy for the rest of today and you should NOT DRIVE or use heavy machinery until tomorrow (because of the sedation medicines used during the test).    FOLLOW UP: Our staff will call the number listed on your records the next business day following your procedure to check on you and address any questions or concerns that you may have regarding the information given to you following your procedure. If we do not reach you, we will leave a message.  However, if you are feeling well and you are not experiencing any problems, there is no need to return our call.  We will assume that you have returned to your regular daily activities without incident.  If any biopsies were taken you will be contacted by phone or by letter within the next 1-3 weeks.  Please call us at [phone] if you have not heard about the biopsies in 3 weeks.    SIGNATURES/CONFIDENTIALITY: You and/or your care partner have signed paperwork which will be entered into your electronic medical record.  These signatures attest to the fact that that the information above on your After Visit Summary has been reviewed and is understood.  Full responsibility of the confidentiality of this discharge information lies with you and/or your care-partner.

## 2019-01-23 NOTE — Progress Notes (Signed)
Called to room to assist during endoscopic procedure.  Patient ID and intended procedure confirmed with present staff. Received instructions for my participation in the procedure from the performing physician.  

## 2019-01-23 NOTE — Progress Notes (Signed)
Pt's states no medical or surgical changes since previsit or office visit. 

## 2019-01-23 NOTE — Progress Notes (Signed)
Report to PACU, RN, vss, BBS= Clear.  

## 2019-01-24 ENCOUNTER — Telehealth: Payer: Self-pay | Admitting: *Deleted

## 2019-01-24 NOTE — Telephone Encounter (Signed)
First follow up call attempt.  Reached voicemail,  Message left to call with questions or concerns.

## 2019-01-24 NOTE — Telephone Encounter (Signed)
  Follow up Call-  Call back number 01/23/2019  Post procedure Call Back phone  # [phone]  Some recent data might be hidden     Patient questions:   Message left to call us if necessary.  Second call.

## 2019-01-26 ENCOUNTER — Encounter: Payer: Self-pay | Admitting: Internal Medicine

## 2019-12-14 ENCOUNTER — Other Ambulatory Visit: Payer: Self-pay | Admitting: Endocrinology

## 2019-12-14 DIAGNOSIS — E785 Hyperlipidemia, unspecified: Secondary | ICD-10-CM

## 2019-12-26 ENCOUNTER — Ambulatory Visit
Admission: RE | Admit: 2019-12-26 | Discharge: 2019-12-26 | Disposition: A | Discharge status: Home / Self Care | Payer: PRIVATE HEALTH INSURANCE | Source: Ambulatory Visit | Attending: Endocrinology | Admitting: Endocrinology

## 2019-12-26 DIAGNOSIS — E785 Hyperlipidemia, unspecified: Secondary | ICD-10-CM

## 2021-03-24 IMAGING — CT CT HEART SCORING
3 series · 14 of 20 positions shown, 16 images · non-contrast
Comparison: None.

CLINICAL DATA: Hyperlipidemia

EXAM:
CT HEART FOR CALCIUM SCORING
TECHNIQUE: CT heart was performed using prospective ECG gating.
A non-contrast exam for calcium scoring was performed.
Note that this exam targets the heart and the chest was not imaged
in its entirety.

[Series 2: calcium scoring 2.00 qr36 bestdiast 73% · axial · 0.37mm/px · z∈[+1570,+1666]mm · 4 of 80 slices shown]
[im 16/80  vessel]
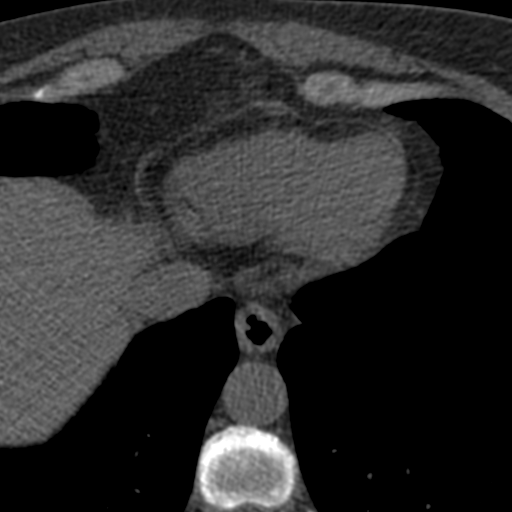
[im 32/80  vessel]
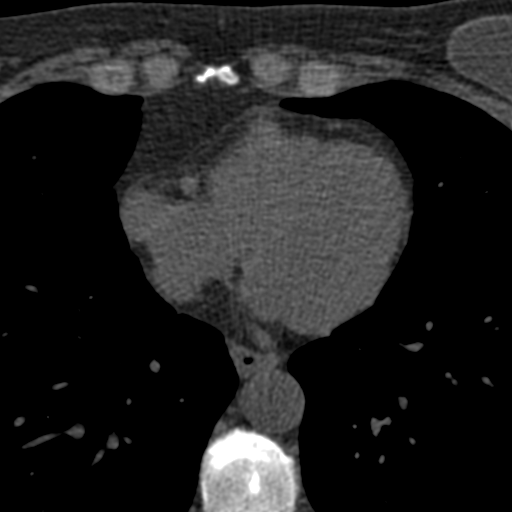
[im 48/80  vessel]
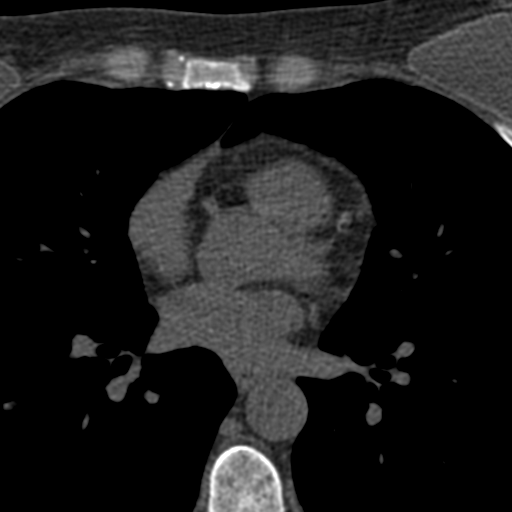
[im 64/80  vessel]
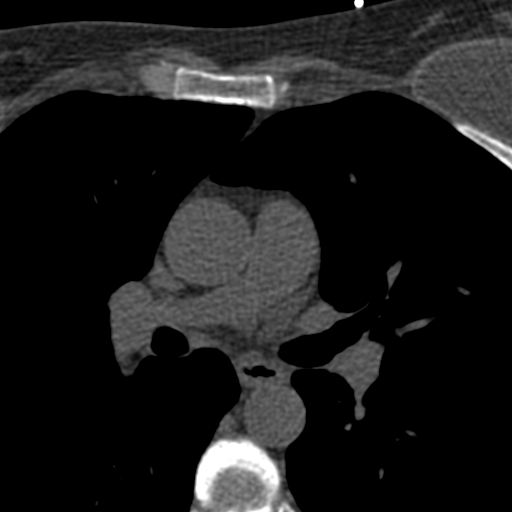

[Series 3: calcium scoring 2.00 br40 bestdiast 73% fov · axial · 0.59mm/px · z∈[+1566,+1670]mm · 5 of 80 slices shown, 7 images]
[im 14/80  vessel]
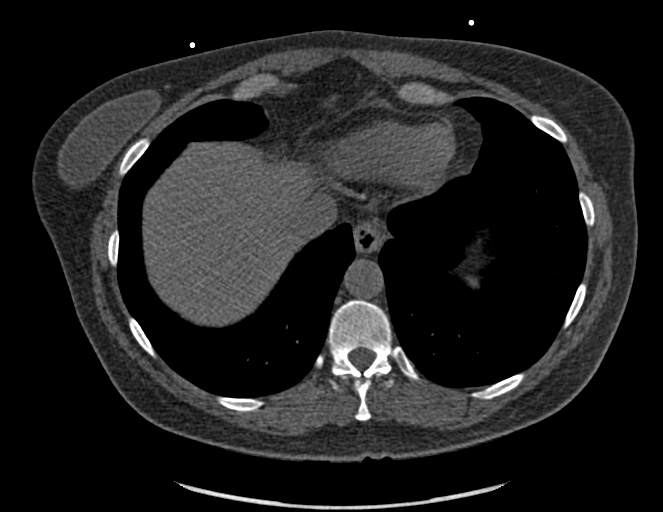
[im 14/80  lung]
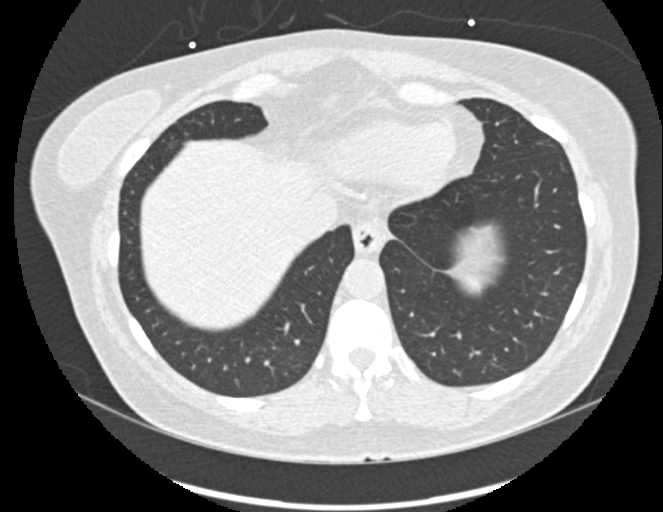
[im 27/80  vessel]
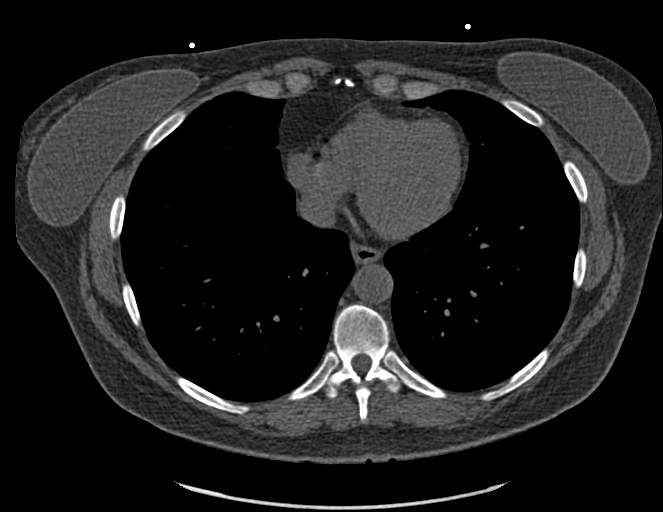
[im 40/80  vessel]
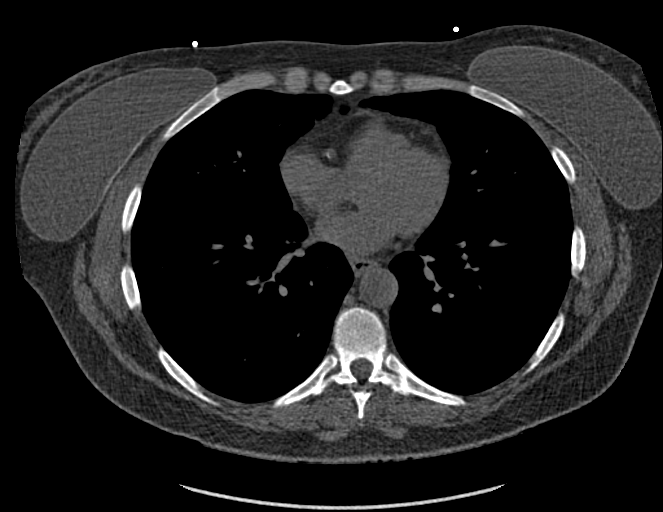
[im 53/80  vessel]
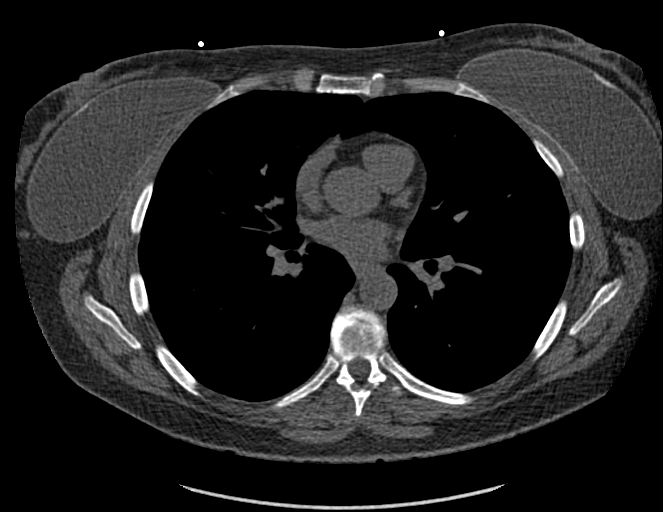
[im 66/80  vessel]
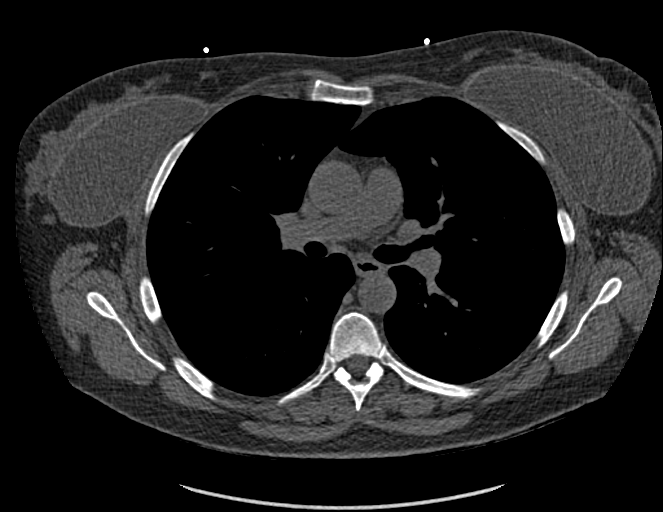
[im 66/80  lung]
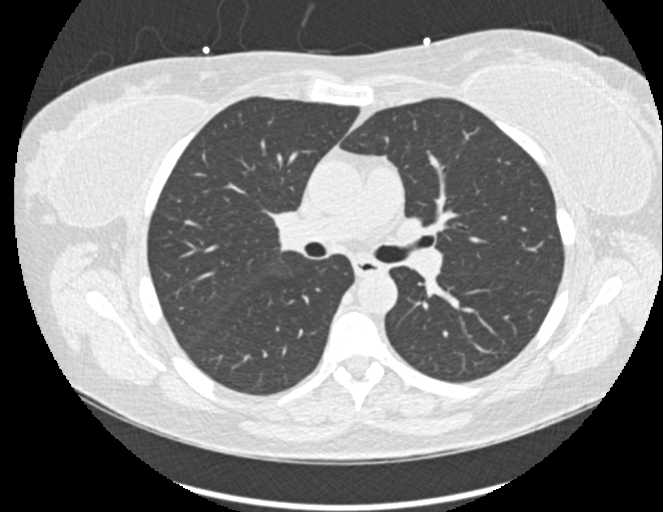

[Series 9: calcium scoring 2.00 br60 bestdiast 73% fov · axial · 0.59mm/px · z∈[+1566,+1670]mm · 5 of 80 slices shown]
[im 14/80  vessel]
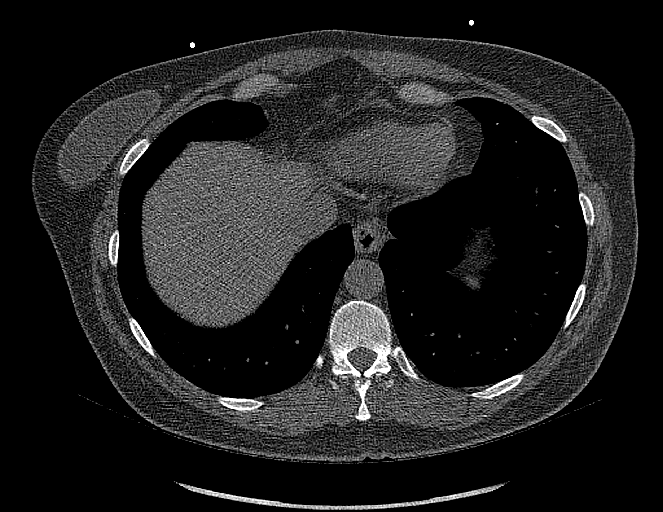
[im 27/80  vessel]
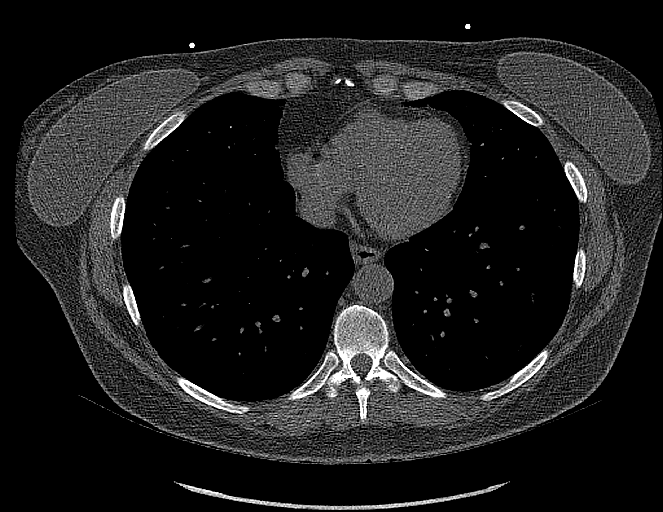
[im 40/80  vessel]
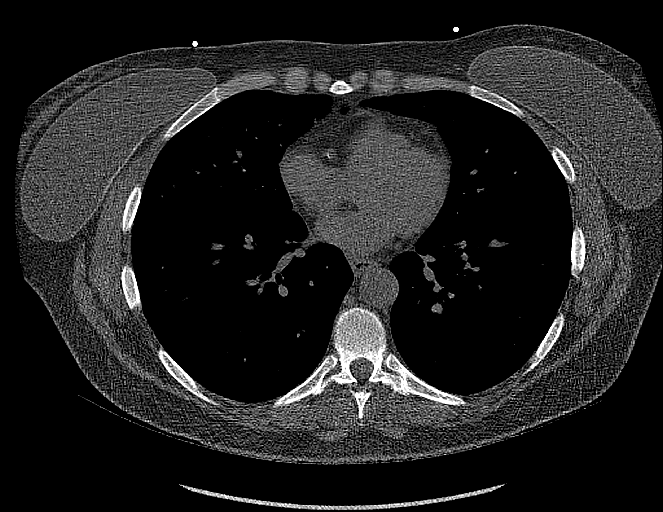
[im 53/80  vessel]
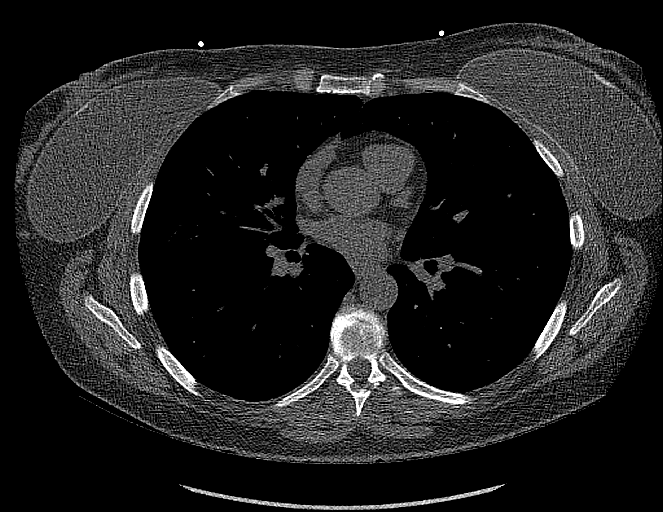
[im 66/80  vessel]
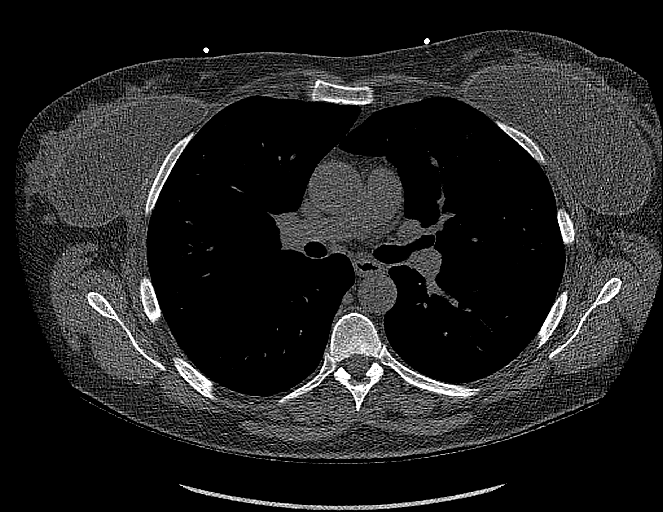

[14 of 20 positions shown; findings below may reference images not displayed]

FINDINGS: Technical quality: Good.

CORONARY CALCIUM

Total Agatston Score:

[HOSPITAL] percentile:  97

OTHER FINDINGS:

Cardiovascular: Heart is normal size. Aorta is normal caliber with
maximum diameter 3.0 cm in the ascending thoracic aorta.

Mediastinum/Nodes: No adenopathy in the lower mediastinum or hila.

Lungs/Pleura: No confluent opacities or effusions.

Upper Abdomen: Imaging into the upper abdomen shows no acute
findings.

Musculoskeletal: Chest wall soft tissues are unremarkable. No acute
bony abnormality.
IMPRESSION: The observed calcium score of 78.8 is at the percentile 97 for
subjects of the same age, gender and race/ethnicity who are free of
clinical cardiovascular disease and treated diabetes.

No acute or significant extracardiac abnormality.

## 2022-09-05 ENCOUNTER — Emergency Department (HOSPITAL_BASED_OUTPATIENT_CLINIC_OR_DEPARTMENT_OTHER)
Admission: EM | Admit: 2022-09-05 | Discharge: 2022-09-05 | Discharge status: Against Medical Advice | Payer: 59 | Attending: Emergency Medicine | Admitting: Emergency Medicine

## 2022-09-05 ENCOUNTER — Encounter (HOSPITAL_BASED_OUTPATIENT_CLINIC_OR_DEPARTMENT_OTHER): Payer: Self-pay

## 2022-09-05 ENCOUNTER — Other Ambulatory Visit: Payer: Self-pay

## 2022-09-05 DIAGNOSIS — R42 Dizziness and giddiness: Secondary | ICD-10-CM | POA: Insufficient documentation

## 2022-09-05 DIAGNOSIS — R519 Headache, unspecified: Secondary | ICD-10-CM | POA: Insufficient documentation

## 2022-09-05 DIAGNOSIS — Z5321 Procedure and treatment not carried out due to patient leaving prior to being seen by health care provider: Secondary | ICD-10-CM | POA: Diagnosis not present

## 2022-09-05 NOTE — ED Triage Notes (Signed)
Pt has had lightheadedness and dizziness x 1 week.  Pt c/o right sided headache x 1 week as well- had gotten better but now worsening.

## 2022-09-10 ENCOUNTER — Ambulatory Visit
Admission: RE | Admit: 2022-09-10 | Discharge: 2022-09-10 | Disposition: A | Discharge status: Home / Self Care | Payer: 59 | Source: Ambulatory Visit | Attending: Endocrinology | Admitting: Endocrinology

## 2022-09-10 ENCOUNTER — Other Ambulatory Visit: Payer: Self-pay | Admitting: Endocrinology

## 2022-09-10 DIAGNOSIS — R42 Dizziness and giddiness: Secondary | ICD-10-CM

## 2022-09-10 DIAGNOSIS — G43011 Migraine without aura, intractable, with status migrainosus: Secondary | ICD-10-CM

## 2024-03-29 ENCOUNTER — Encounter: Payer: Self-pay | Admitting: Internal Medicine

## 2024-05-30 ENCOUNTER — Other Ambulatory Visit: Payer: Self-pay | Admitting: Endocrinology

## 2024-05-30 DIAGNOSIS — N838 Other noninflammatory disorders of ovary, fallopian tube and broad ligament: Secondary | ICD-10-CM

## 2024-05-31 ENCOUNTER — Ambulatory Visit
Admission: RE | Admit: 2024-05-31 | Discharge: 2024-05-31 | Disposition: A | Discharge status: Home / Self Care | Source: Ambulatory Visit | Attending: Endocrinology | Admitting: Endocrinology

## 2024-05-31 DIAGNOSIS — N838 Other noninflammatory disorders of ovary, fallopian tube and broad ligament: Secondary | ICD-10-CM

## 2024-05-31 MED ORDER — IOPAMIDOL (ISOVUE-300) INJECTION 61%
100.0000 mL | Freq: Once | INTRAVENOUS | Status: AC | PRN
  Administered 2024-05-31: 100 mL via INTRAVENOUS

## 2024-08-08 ENCOUNTER — Encounter: Payer: Self-pay | Admitting: Internal Medicine

## 2024-08-31 ENCOUNTER — Ambulatory Visit (AMBULATORY_SURGERY_CENTER)

## 2024-08-31 VITALS — Ht 66.5 in | Wt 154.0 lb

## 2024-08-31 DIAGNOSIS — Z860100 Personal history of colon polyps, unspecified: Secondary | ICD-10-CM

## 2024-08-31 MED ORDER — NA SULFATE-K SULFATE-MG SULF 17.5-3.13-1.6 GM/177ML PO SOLN: 1.0000 | Freq: Once | ORAL | 0 refills | Status: AC

## 2024-08-31 NOTE — Progress Notes (Signed)
 No issues known to pt with past sedation with any surgeries or procedures Patient denies ever being told they had issues or difficulty with intubation  No FH of Malignant Hyperthermia Pt is not on diet pills nor GLP-1 medicatoins Pt is not on home 02  Pt is not on blood thinners  Pt denies issues with chronic constipation  No A fib or A flutter Have any cardiac testing pending--no Pt instructed to use [email or web address] or GoodRx for a price reduction on prep  Ambulates independently

## 2024-09-11 ENCOUNTER — Encounter: Payer: Self-pay | Admitting: Internal Medicine

## 2024-09-14 ENCOUNTER — Encounter: Payer: Self-pay | Admitting: Internal Medicine

## 2024-09-14 ENCOUNTER — Ambulatory Visit: Admitting: Internal Medicine

## 2024-09-14 VITALS — BP 127/88 | HR 62 | Temp 97.7°F | Resp 19 | Ht 66.5 in | Wt 154.0 lb

## 2024-09-14 DIAGNOSIS — Z1211 Encounter for screening for malignant neoplasm of colon: Secondary | ICD-10-CM

## 2024-09-14 DIAGNOSIS — K648 Other hemorrhoids: Secondary | ICD-10-CM | POA: Diagnosis not present

## 2024-09-14 DIAGNOSIS — Z860101 Personal history of adenomatous and serrated colon polyps: Secondary | ICD-10-CM

## 2024-09-14 DIAGNOSIS — Z860100 Personal history of colon polyps, unspecified: Secondary | ICD-10-CM

## 2024-09-14 MED ORDER — SODIUM CHLORIDE 0.9 % IV SOLN: 500.0000 mL | INTRAVENOUS | Status: DC

## 2024-09-14 NOTE — Op Note (Signed)
 [facility] Patient Name: Allison Lewis Procedure Date: 09/14/2024 11:09 AM MRN: [id number] Endoscopist: Norleen SAILOR. Abran , MD, [id number] Age: 58 Referring MD:  Date of Birth: 11/15/1966 Gender: Female Account #: [id number] Procedure:                Colonoscopy Indications:              High risk colon cancer surveillance: Personal                            history of non-advanced adenoma. Previous                            examination 2020 Medicines:                Monitored Anesthesia Care Procedure:                Pre-Anesthesia Assessment:                           - Prior to the procedure, a History and Physical                            was performed, and patient medications and                            allergies were reviewed. The patient's tolerance of                            previous anesthesia was also reviewed. The risks                            and benefits of the procedure and the sedation                            options and risks were discussed with the patient.                            All questions were answered, and informed consent                            was obtained. Prior Anticoagulants: The patient has                            taken no anticoagulant or antiplatelet agents. ASA                            Grade Assessment: II - A patient with mild systemic                            disease. After reviewing the risks and benefits,                            the patient was deemed in satisfactory condition to  undergo the procedure.                           After obtaining informed consent, the colonoscope                            was passed under direct vision. Throughout the                            procedure, the patient's blood pressure, pulse, and                            oxygen saturations were monitored continuously. The                            CF [id number] #[id number] was introduced through the anus                             and advanced to the the cecum, identified by                            appendiceal orifice and ileocecal valve. The                            ileocecal valve, appendiceal orifice, and rectum                            were photographed. The quality of the bowel                            preparation was excellent. The colonoscopy was                            performed without difficulty. The patient tolerated                            the procedure well. The bowel preparation used was                            SUPREP via split dose instruction. Scope In: 11:20:54 AM Scope Out: 11:32:27 AM Scope Withdrawal Time: 0 hours 9 minutes 4 seconds  Total Procedure Duration: 0 hours 11 minutes 33 seconds  Findings:                 The entire examined colon appeared normal on direct                            and retroflexion views. Small internal hemorrhoids Complications:            No immediate complications. Estimated blood loss:                            None. Estimated Blood Loss:     Estimated blood loss: none. Impression:               - The  entire examined colon is normal on direct and                            retroflexion views.                           - No specimens collected. Recommendation:           - Repeat colonoscopy in 10 years for surveillance.                           - Patient has a contact number available for                            emergencies. The signs and symptoms of potential                            delayed complications were discussed with the                            patient. Return to normal activities tomorrow.                            Written discharge instructions were provided to the                            patient.                           - Resume previous diet.                           - Continue present medications. Norleen SAILOR. Abran, MD 09/14/2024 11:41:03 AM This report has been signed electronically.

## 2024-09-14 NOTE — Progress Notes (Signed)
 A/o x 3, VSS, gd SR's, pleased with anesthesia, report to RN

## 2024-09-14 NOTE — Progress Notes (Signed)
 HISTORY OF PRESENT ILLNESS:  Allison Lewis is a 58 y.o. female with a history of adenomatous colon polyp.  Now for surveillance colonoscopy.  No complaints  REVIEW OF SYSTEMS:  All non-GI ROS negative except for  Past Medical History:  Diagnosis Date   Anxiety     Past Surgical History:  Procedure Laterality Date   COLONOSCOPY     PLACEMENT OF BREAST IMPLANTS  2000   TUMMY TUCK      Social History Allison Lewis  reports that she has been smoking e-cigarettes and cigarettes. She has never used smokeless tobacco. She reports current alcohol  use of about 3.0 standard drinks of alcohol  per week. She reports that she does not use drugs.  family history includes Breast cancer in her mother; Liver cancer in her mother; Lung cancer in her maternal grandfather and maternal grandmother; Pancreatic cancer in her father; Uterine cancer in her mother.  No Known Allergies     PHYSICAL EXAMINATION: Vital signs: BP (!) 140/95   Pulse 84   Temp 97.7 F (36.5 C)   Ht 5' 6.5 (1.689 m)   Wt 154 lb (69.9 kg)   SpO2 98%   BMI 24.48 kg/m  General: Well-developed, well-nourished, no acute distress HEENT: Sclerae are anicteric, conjunctiva pink. Oral mucosa intact Lungs: Clear Heart: Regular Abdomen: soft, nontender, nondistended, no obvious ascites, no peritoneal signs, normal bowel sounds. No organomegaly. Extremities: No edema Psychiatric: alert and oriented x3. Cooperative     ASSESSMENT:  History of adenomatous colon polyp   PLAN:   Surveillance colonoscopy

## 2024-09-14 NOTE — Patient Instructions (Signed)

## 2024-09-14 NOTE — Progress Notes (Signed)
 Pt's states no medical or surgical changes since previsit or office visit.

## 2024-09-15 ENCOUNTER — Telehealth: Payer: Self-pay | Admitting: *Deleted

## 2024-09-15 NOTE — Telephone Encounter (Signed)
 Left message on f/u call

## 2024-09-15 NOTE — Telephone Encounter (Deleted)
  Follow up Call-     09/14/2024   10:34 AM  Call back number  Post procedure Call Back phone  # [phone]  Permission to leave phone message Yes     Patient questions:  Do you have a fever, pain , or abdominal swelling? {yes no:314532} Pain Score  {NUMBERS; 0-10:5044} *  Have you tolerated food without any problems? {yes no:314532}  Have you been able to return to your normal activities? {yes no:314532}  Do you have any questions about your discharge instructions: Diet   {yes no:314532} Medications  {yes no:314532} Follow up visit  {yes no:314532}  Do you have questions or concerns about your Care? {yes no:314532}  Actions: * If pain score is 4 or above: {ACTION; [facility] ENDO PAIN >4:21563}
